# Patient Record
Sex: Male | Born: 2000 | Race: White | Hispanic: Yes | Marital: Single
Health system: Southern US, Community
[De-identification: ages and names within clinical notes are randomized; demographics above are authoritative.]

---

## 2019-01-13 ENCOUNTER — Emergency Department (HOSPITAL_COMMUNITY): Payer: Self-pay

## 2019-01-13 ENCOUNTER — Emergency Department (HOSPITAL_COMMUNITY)
Admission: EM | Admit: 2019-01-13 | Discharge: 2019-01-13 | Disposition: A | Discharge status: Home / Self Care | Payer: Self-pay | Attending: Emergency Medicine | Admitting: Emergency Medicine

## 2019-01-13 ENCOUNTER — Other Ambulatory Visit: Payer: Self-pay

## 2019-01-13 DIAGNOSIS — R0789 Other chest pain: Secondary | ICD-10-CM | POA: Insufficient documentation

## 2019-01-13 MED ORDER — IBUPROFEN 800 MG PO TABS
800.0000 mg | ORAL_TABLET | Freq: Once | ORAL | Status: AC
  Administered 2019-01-13: 800 mg via ORAL
  Filled 2019-01-13: qty 1

## 2019-01-13 NOTE — ED Provider Notes (Signed)
[facility]   CSN: [id number] Arrival date & time: 01/13/19  1137    History   Chief Complaint Chief Complaint  Patient presents with  . Chest Pain    HPI Jeffery Spencer is a 18 y.o. male.     The history is provided by the patient.  Chest Pain  Pain location:  R chest and L chest Pain quality: aching   Pain radiates to:  Does not radiate Pain severity:  Mild Onset quality:  Gradual Duration:  10 days Timing:  Intermittent Progression:  Waxing and waning Chronicity:  New Context: lifting   Relieved by:  Nothing Worsened by:  Nothing Associated symptoms: no abdominal pain, no anxiety, no back pain, no cough, no fever, no palpitations, no shortness of breath and no vomiting   Risk factors: no coronary artery disease, no diabetes mellitus, no high cholesterol, no hypertension and no prior DVT/PE     No past medical history on file.  There are no active problems to display for this patient.         Home Medications    Prior to Admission medications   Not on File    Family History No family history on file.  Social History Social History   Tobacco Use  . Smoking status: Not on file  Substance Use Topics  . Alcohol use: Not on file  . Drug use: Not on file     Allergies   Patient has no allergy information on record.   Review of Systems Review of Systems  Constitutional: Negative for chills and fever.  HENT: Negative for ear pain and sore throat.   Eyes: Negative for pain and visual disturbance.  Respiratory: Negative for cough and shortness of breath.   Cardiovascular: Positive for chest pain. Negative for palpitations.  Gastrointestinal: Negative for abdominal pain and vomiting.  Genitourinary: Negative for dysuria and hematuria.  Musculoskeletal: Negative for arthralgias and back pain.  Skin: Negative for color change and rash.  Neurological: Negative for seizures and syncope.  All  other systems reviewed and are negative.    Physical Exam Updated Vital Signs  ED Triage Vitals  Enc Vitals Group     BP 01/13/19 1152 126/71     Pulse Rate 01/13/19 1152 66     Resp 01/13/19 1152 17     Temp 01/13/19 1152 97.8 F (36.6 C)     Temp Source 01/13/19 1152 Oral     SpO2 01/13/19 1152 99 %     Weight 01/13/19 1149 190 lb (86.2 kg)     Height 01/13/19 1149 6\' 2"  (1.88 m)     Head Circumference --      Peak Flow --      Pain Score 01/13/19 1148 1     Pain Loc --      Pain Edu? --      Excl. in GC? --      Physical Exam Vitals signs and nursing note reviewed.  Constitutional:      Appearance: He is well-developed.  HENT:     Head: Normocephalic and atraumatic.  Eyes:     Extraocular Movements: Extraocular movements intact.     Conjunctiva/sclera: Conjunctivae normal.     Pupils: Pupils are equal, round, and reactive to light.  Neck:     Musculoskeletal: Neck supple.  Cardiovascular:     Rate and Rhythm: Normal rate and regular rhythm.     Pulses:  Radial pulses are 2+ on the right side and 2+ on the left side.       Dorsalis pedis pulses are 2+ on the right side and 2+ on the left side.     Heart sounds: Normal heart sounds. No murmur.  Pulmonary:     Effort: Pulmonary effort is normal. No respiratory distress.     Breath sounds: Normal breath sounds. No decreased breath sounds, wheezing, rhonchi or rales.  Chest:     Chest wall: Tenderness present.  Abdominal:     Palpations: Abdomen is soft.     Tenderness: There is no abdominal tenderness.  Musculoskeletal:     Right lower leg: No edema.     Left lower leg: No edema.  Skin:    General: Skin is warm and dry.     Capillary Refill: Capillary refill takes less than 2 seconds.  Neurological:     General: No focal deficit present.     Mental Status: He is alert.      ED Treatments / Results  Labs (all labs ordered are listed, but only abnormal results are displayed) Labs Reviewed - No  data to display  EKG EKG Interpretation  Date/Time:  Saturday January 13 2019 11:52:45 EDT Ventricular Rate:  70 PR Interval:    QRS Duration: 101 QT Interval:  368 QTC Calculation: 397 R Axis:   77 Text Interpretation:  Sinus rhythm Confirmed by Virgina Norfolk [phone]) on 01/13/2019 11:55:47 AM   Radiology Dg Chest 2 View  Result Date: 01/13/2019 CLINICAL DATA:  Approximate 1-1/2 week history of LEFT-sided chest pain and shortness of breath. EXAM: CHEST - 2 VIEW COMPARISON:  None. FINDINGS: Cardiomediastinal silhouette unremarkable. Lungs clear. Bronchovascular markings normal. Pulmonary vascularity normal. No pneumothorax. No pleural effusions. Visualized bony thorax intact. IMPRESSION: Normal examination. Electronically Signed   By: Hulan Saas M.D.   On: 01/13/2019 12:57    Procedures Procedures (including critical care time)  Medications Ordered in ED Medications  ibuprofen (ADVIL,MOTRIN) tablet 800 mg (800 mg Oral Given 01/13/19 1155)     Initial Impression / Assessment and Plan / ED Course  I have reviewed the triage vital signs and the nursing notes.  Pertinent labs & imaging results that were available during my care of the patient were reviewed by me and considered in my medical decision making (see chart for details).     Jeffery Spencer is a 18 year old male with no significant medical history who presents to the ED with chest pain.  Patient with unremarkable vitals.  No fever.  Patient with intermittent chest pain for the past 10 days.  Works [employer] worker job.  Does a lot of lifting at work.  Has some reproducible chest pain on exam.  Clear breath sounds.  No infectious symptoms.  No cardiac history and no cardiac history in the family.  EKG shows sinus rhythm.  No ischemic changes.  Chest x-ray showed no signs of pneumonia, pneumothorax, pleural effusion.  Overall suspect musculoskeletal type pain.  Given Motrin.  Recommend Motrin at home.  Written for light duty at  work and discharged from ED in good condition.  Given return precautions.  Given follow-up to primary care.  This chart was dictated using voice recognition software.  Despite best efforts to proofread,  errors can occur which can change the documentation meaning.    Final Clinical Impressions(s) / ED Diagnoses   Final diagnoses:  Atypical chest pain    ED Discharge Orders    None  Virgina Norfolk, DO 01/13/19 1303

## 2019-01-13 NOTE — ED Triage Notes (Signed)
Pt c/o  Left sided Non radiating chest pain on and off x 1.5 weeks with slight sob ; denies any fevers

## 2019-01-13 NOTE — ED Notes (Signed)
Patient verbalizes understanding of discharge instructions. Opportunity for questioning and answering were provided, patient discharged from ED. 

## 2019-03-16 ENCOUNTER — Other Ambulatory Visit (HOSPITAL_COMMUNITY): Payer: Self-pay | Admitting: Orthopedic Surgery

## 2019-03-16 ENCOUNTER — Other Ambulatory Visit: Payer: Self-pay | Admitting: Orthopedic Surgery

## 2019-03-16 DIAGNOSIS — M25562 Pain in left knee: Secondary | ICD-10-CM

## 2019-03-26 ENCOUNTER — Other Ambulatory Visit: Payer: Self-pay

## 2019-03-26 ENCOUNTER — Ambulatory Visit (HOSPITAL_COMMUNITY)
Admission: RE | Admit: 2019-03-26 | Discharge: 2019-03-26 | Disposition: A | Discharge status: Home / Self Care | Payer: Medicaid Other | Source: Ambulatory Visit | Attending: Orthopedic Surgery | Admitting: Orthopedic Surgery

## 2019-03-26 DIAGNOSIS — M25562 Pain in left knee: Secondary | ICD-10-CM

## 2019-04-25 ENCOUNTER — Other Ambulatory Visit: Payer: Self-pay | Admitting: Orthopedic Surgery

## 2019-05-28 ENCOUNTER — Ambulatory Visit (HOSPITAL_BASED_OUTPATIENT_CLINIC_OR_DEPARTMENT_OTHER): Admit: 2019-05-28 | Payer: MEDICAID | Admitting: Orthopedic Surgery

## 2019-05-28 ENCOUNTER — Encounter (HOSPITAL_BASED_OUTPATIENT_CLINIC_OR_DEPARTMENT_OTHER): Payer: Self-pay

## 2019-05-28 SURGERY — REPAIR, KNEE, ACL
Anesthesia: General | Site: Knee | Laterality: Left

## 2021-03-17 IMAGING — MR MRI OF THE LEFT KNEE WITHOUT CONTRAST
4 of 7 series · 19 of 40 positions shown · non-contrast
Comparison: None.

CLINICAL DATA: Acute pain, left knee. Twisting injury playing
soccer

EXAM:
MRI OF THE LEFT KNEE WITHOUT CONTRAST
TECHNIQUE: Multiplanar, multisequence MR imaging of the knee was performed. No
intravenous contrast was administered.

[Series 2: T2 fat-sat · axial · 4.0mm · 0.27mm/px · z∈[-99,+11]mm · 3 of 23 slices shown]
[im 1/23]
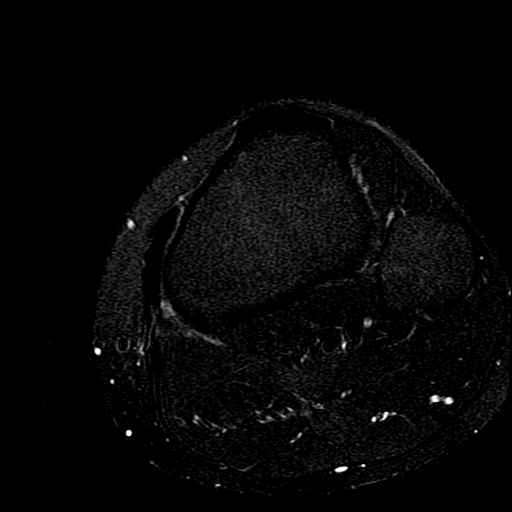
[im 15/23]
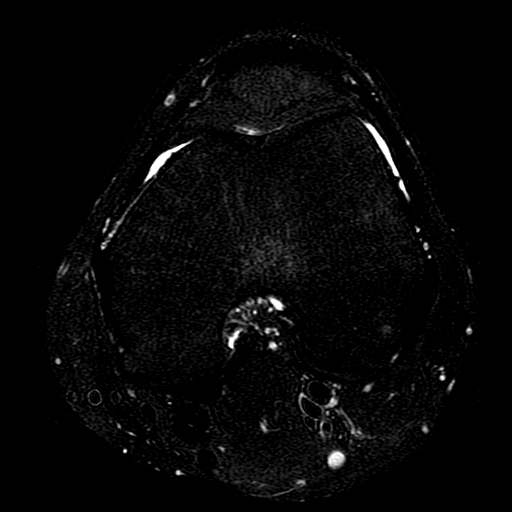
[im 23/23]
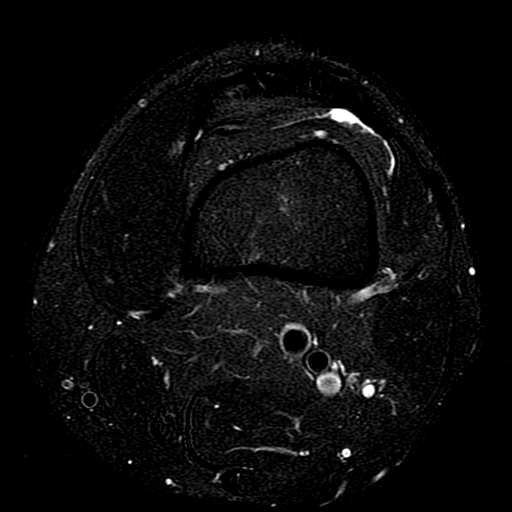

[Series 5: PD fat-sat · coronal · 3.0mm · 0.29mm/px · 7 of 30 slices shown (1 of 2)]
[im 1/30]
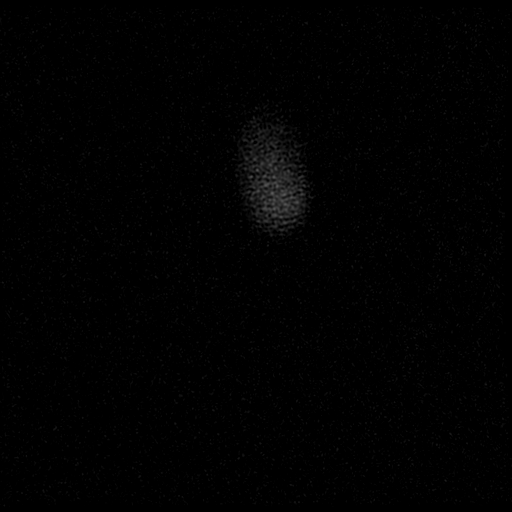
[im 5/30]
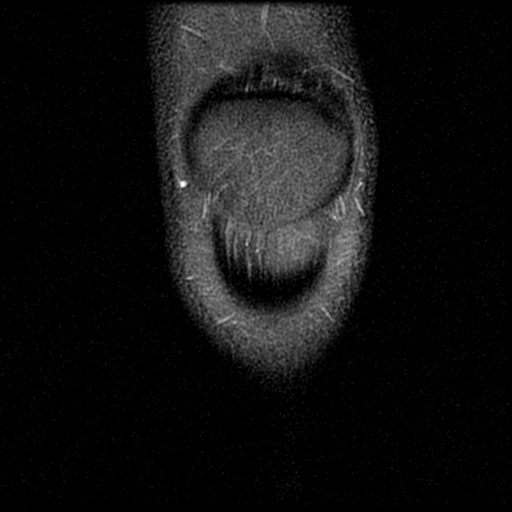
[im 10/30]
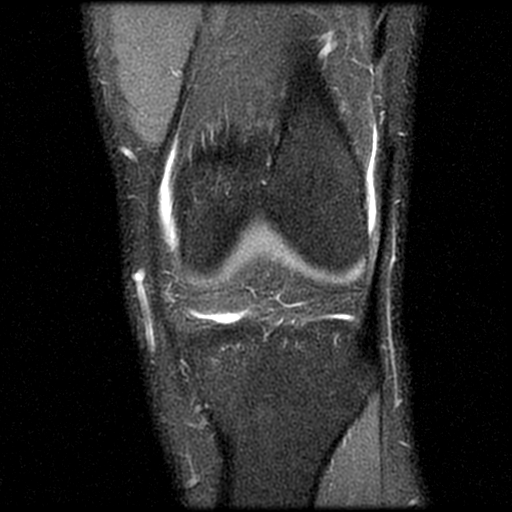
[im 15/30]
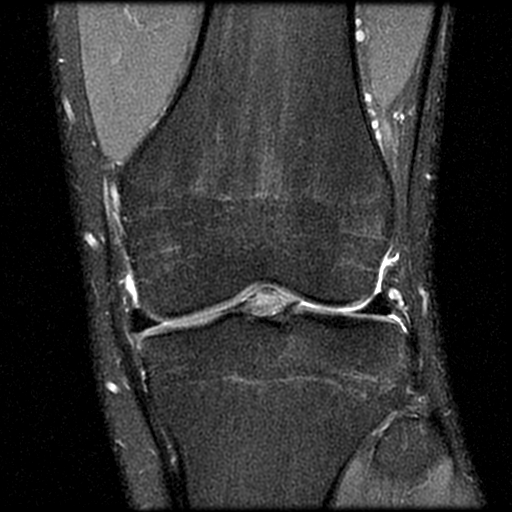
[im 20/30]
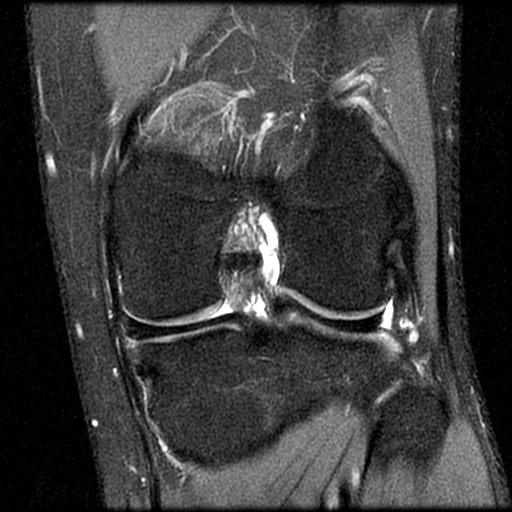
[im 25/30]
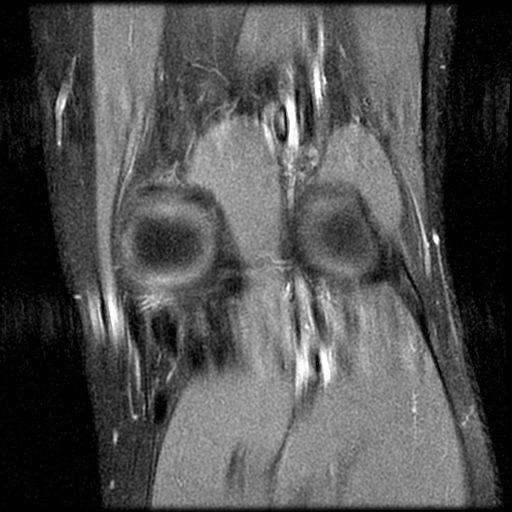
[im 30/30]
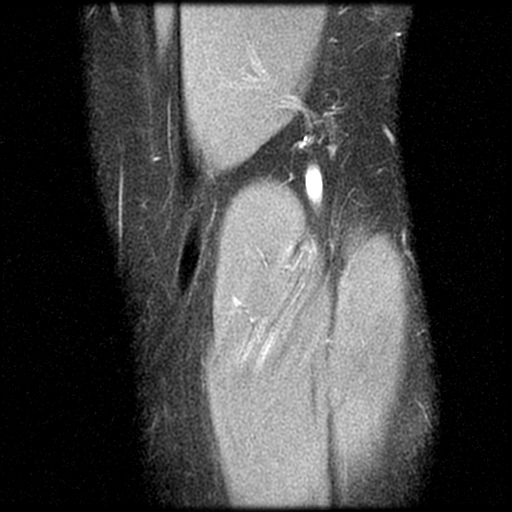

[Series 6: PD fat-sat · sagittal · 3.0mm · 0.29mm/px · 6 of 26 slices shown (2 of 2)]
[im 1/26]
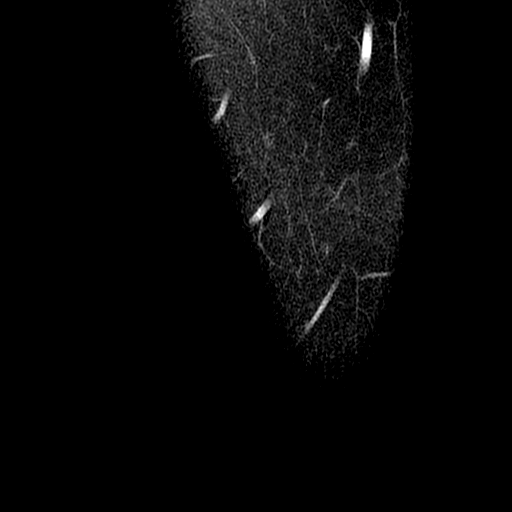
[im 6/26]
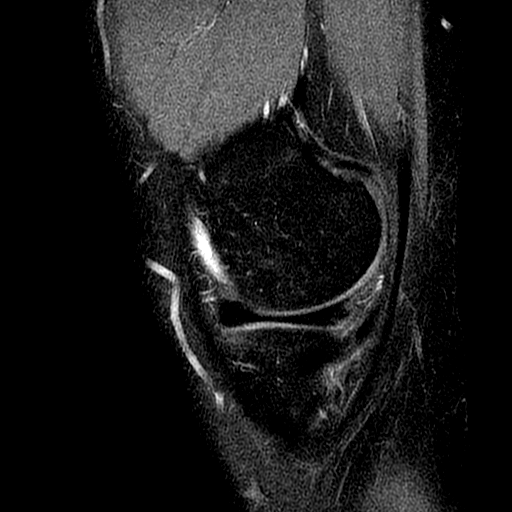
[im 11/26]
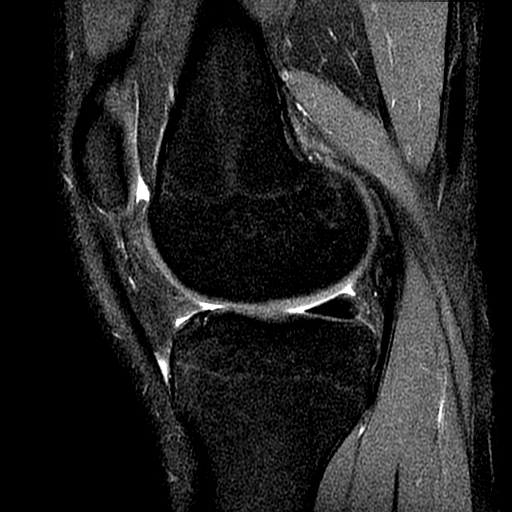
[im 16/26]
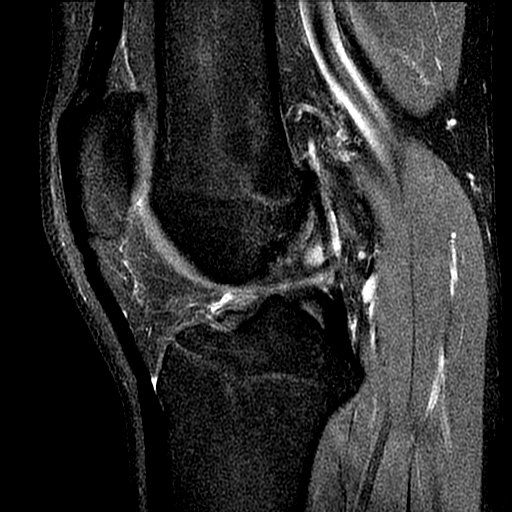
[im 21/26]
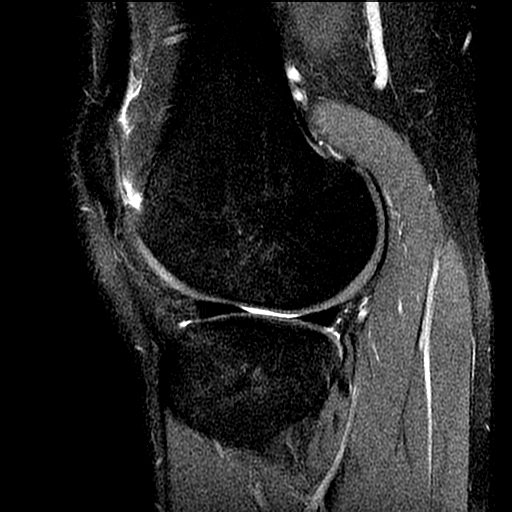
[im 26/26]
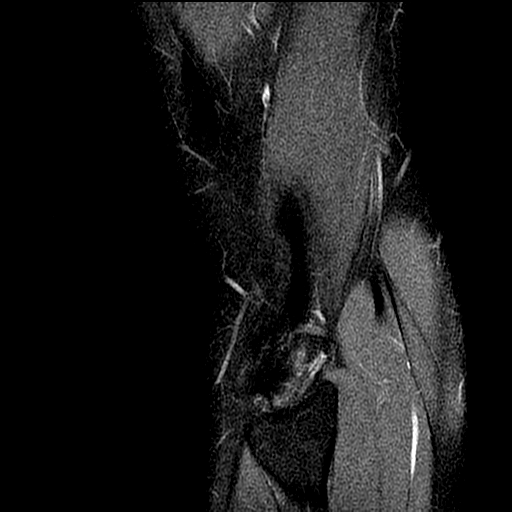

[Series 8: PD · oblique · 2.0mm · 0.29mm/px · 3 of 20 slices shown]
[im 1/20]
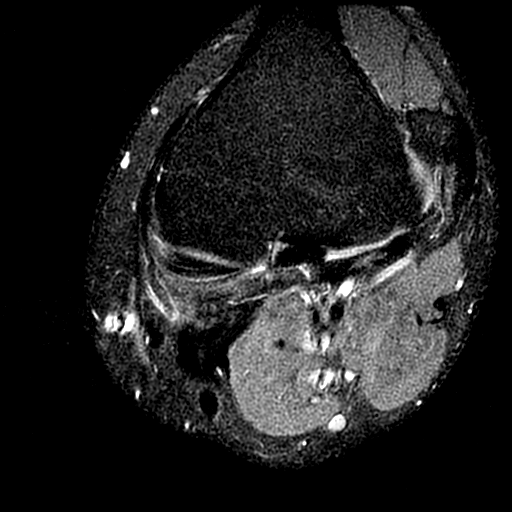
[im 10/20]
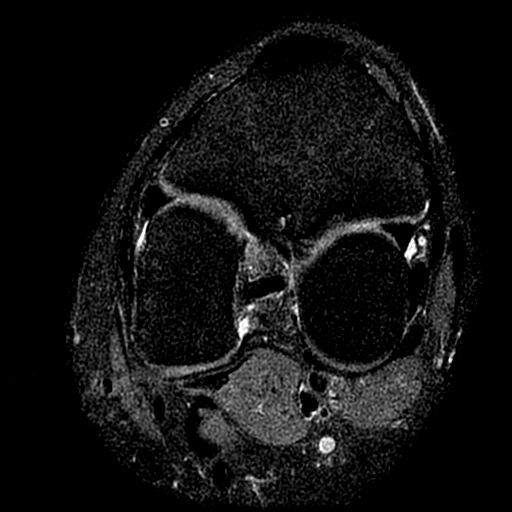
[im 20/20]
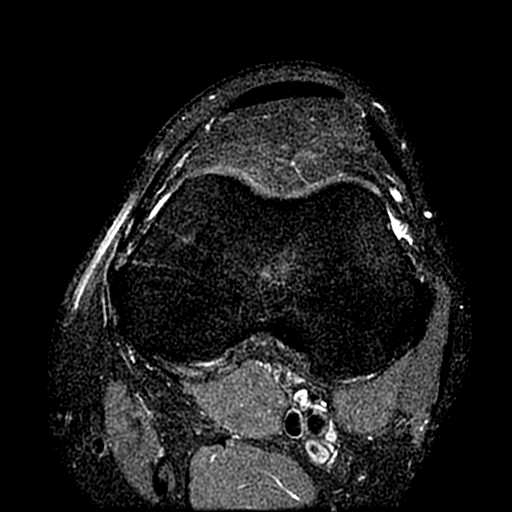

[19 of 40 positions shown; findings below may reference images not displayed]

FINDINGS: MENISCI

Medial meniscus:  Unremarkable

Lateral meniscus:  Unremarkable

LIGAMENTS

Cruciates: Completely torn anterior cruciate ligament as shown on
image [DATE]. Posterior cruciate ligament intact.

Collaterals:  Unremarkable

CARTILAGE

Patellofemoral:  Unremarkable

Medial:  Unremarkable

Lateral:  Unremarkable

Joint:  Upper normal amount of fluid in the knee joint.

Popliteal Fossa:  Unremarkable

Extensor Mechanism:  Unremarkable

Bones:  Unremarkable

Other: No supplemental non-categorized findings.
IMPRESSION: 1. The dominant finding is a complete tear of the anterior cruciate
ligament.
